# Patient Record
Sex: Male | Born: 1955 | Race: Black or African American | Hispanic: No | Marital: Single | Smoking: Current every day smoker
Health system: Southern US, Community
[De-identification: ages and names within clinical notes are randomized; demographics above are authoritative.]

## PROBLEM LIST (undated history)

## (undated) HISTORY — PX: KNEE SURGERY: SHX244

---

## 2016-12-28 ENCOUNTER — Emergency Department (HOSPITAL_BASED_OUTPATIENT_CLINIC_OR_DEPARTMENT_OTHER)
Admission: EM | Admit: 2016-12-28 | Discharge: 2016-12-28 | Disposition: A | Discharge status: Home / Self Care | Payer: BLUE CROSS/BLUE SHIELD | Attending: Emergency Medicine | Admitting: Emergency Medicine

## 2016-12-28 ENCOUNTER — Emergency Department (HOSPITAL_BASED_OUTPATIENT_CLINIC_OR_DEPARTMENT_OTHER): Payer: BLUE CROSS/BLUE SHIELD

## 2016-12-28 ENCOUNTER — Encounter (HOSPITAL_BASED_OUTPATIENT_CLINIC_OR_DEPARTMENT_OTHER): Payer: Self-pay

## 2016-12-28 DIAGNOSIS — F17200 Nicotine dependence, unspecified, uncomplicated: Secondary | ICD-10-CM | POA: Diagnosis not present

## 2016-12-28 DIAGNOSIS — M542 Cervicalgia: Secondary | ICD-10-CM | POA: Diagnosis not present

## 2016-12-28 DIAGNOSIS — Y939 Activity, unspecified: Secondary | ICD-10-CM | POA: Insufficient documentation

## 2016-12-28 DIAGNOSIS — Y999 Unspecified external cause status: Secondary | ICD-10-CM | POA: Insufficient documentation

## 2016-12-28 DIAGNOSIS — Y92410 Unspecified street and highway as the place of occurrence of the external cause: Secondary | ICD-10-CM | POA: Insufficient documentation

## 2016-12-28 DIAGNOSIS — S199XXA Unspecified injury of neck, initial encounter: Secondary | ICD-10-CM | POA: Diagnosis present

## 2016-12-28 MED ORDER — METHOCARBAMOL 500 MG PO TABS: 500.0000 mg | ORAL_TABLET | Freq: Two times a day (BID) | ORAL | 0 refills | Status: AC

## 2016-12-28 MED ORDER — LIDOCAINE 5 % EX PTCH: 1.0000 | MEDICATED_PATCH | CUTANEOUS | 0 refills | Status: AC

## 2016-12-28 MED ORDER — IBUPROFEN 800 MG PO TABS: 800.0000 mg | ORAL_TABLET | Freq: Three times a day (TID) | ORAL | 0 refills | Status: AC
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
## 2016-12-28 NOTE — ED Notes (Addendum)
Pt requesting work note for 5 days, instructed pt we can only do one day, pt also states " why didn't you call my prescriptions in?" instructed that we have a pham and we don't call scripts in

## 2016-12-28 NOTE — Discharge Instructions (Signed)
Expect your soreness to increase over the next 2-3 days. Take it easy, but do not lay around too much as this may make any stiffness worse.  °Antiinflammatory medications: Take 500 mg of naproxen every 12 hours or 800 mg of ibuprofen every 8 hours for the next 3 days. Take these medications with food to avoid upset stomach. Choose only one of these medications, do not take them together. ° °Muscle relaxer: Robaxin is a muscle relaxer and may help loosen stiff muscles. Do not take the Robaxin while driving or performing other dangerous activities.  ° °Lidocaine patches: These are available via either prescription or over-the-counter. The over-the-counter option may be more economical one and are likely just as effective. There are multiple over-the-counter brands, such as Salonpas. ° °Exercises: Be sure to perform the attached exercises starting with three times a week and working up to performing them daily. This is an essential part of preventing long term problems.  ° °Follow up with a primary care provider for any future management of these complaints. °
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
